# Patient Record
Sex: Male | Born: 1965 | Race: White | Hispanic: No | Marital: Single | Smoking: Current every day smoker
Health system: Southern US, Community
[De-identification: ages and names within clinical notes are randomized; demographics above are authoritative.]

## PROBLEM LIST (undated history)

## (undated) DIAGNOSIS — Z789 Other specified health status: Secondary | ICD-10-CM

## (undated) HISTORY — PX: NO PAST SURGERIES: SHX2092

---

## 1997-07-24 ENCOUNTER — Emergency Department (HOSPITAL_COMMUNITY): Admission: EM | Admit: 1997-07-24 | Discharge: 1997-07-24 | Payer: Self-pay | Admitting: Emergency Medicine

## 1998-02-27 ENCOUNTER — Emergency Department (HOSPITAL_COMMUNITY): Admission: EM | Admit: 1998-02-27 | Discharge: 1998-02-27 | Payer: Self-pay | Admitting: Emergency Medicine

## 1998-02-27 ENCOUNTER — Encounter: Payer: Self-pay | Admitting: Emergency Medicine

## 2005-11-07 ENCOUNTER — Ambulatory Visit (HOSPITAL_COMMUNITY): Admission: RE | Admit: 2005-11-07 | Discharge: 2005-11-07 | Payer: Self-pay | Admitting: Internal Medicine

## 2021-04-13 ENCOUNTER — Emergency Department (HOSPITAL_BASED_OUTPATIENT_CLINIC_OR_DEPARTMENT_OTHER): Payer: Self-pay

## 2021-04-13 ENCOUNTER — Emergency Department (HOSPITAL_BASED_OUTPATIENT_CLINIC_OR_DEPARTMENT_OTHER): Payer: Self-pay | Admitting: Anesthesiology

## 2021-04-13 ENCOUNTER — Other Ambulatory Visit: Payer: Self-pay

## 2021-04-13 ENCOUNTER — Ambulatory Visit (HOSPITAL_BASED_OUTPATIENT_CLINIC_OR_DEPARTMENT_OTHER)
Admission: EM | Admit: 2021-04-13 | Discharge: 2021-04-13 | Disposition: A | Discharge status: Home / Self Care | Payer: Self-pay | Attending: Emergency Medicine | Admitting: Emergency Medicine

## 2021-04-13 ENCOUNTER — Encounter (HOSPITAL_BASED_OUTPATIENT_CLINIC_OR_DEPARTMENT_OTHER): Payer: Self-pay

## 2021-04-13 ENCOUNTER — Encounter (HOSPITAL_COMMUNITY)
Admission: EM | Disposition: A | Discharge status: Home / Self Care | Payer: Self-pay | Source: Home / Self Care | Attending: Emergency Medicine

## 2021-04-13 ENCOUNTER — Emergency Department (HOSPITAL_COMMUNITY): Payer: Self-pay | Admitting: Anesthesiology

## 2021-04-13 DIAGNOSIS — Z23 Encounter for immunization: Secondary | ICD-10-CM | POA: Insufficient documentation

## 2021-04-13 DIAGNOSIS — F17210 Nicotine dependence, cigarettes, uncomplicated: Secondary | ICD-10-CM | POA: Insufficient documentation

## 2021-04-13 DIAGNOSIS — S61213A Laceration without foreign body of left middle finger without damage to nail, initial encounter: Secondary | ICD-10-CM | POA: Insufficient documentation

## 2021-04-13 DIAGNOSIS — S68121A Partial traumatic metacarpophalangeal amputation of left index finger, initial encounter: Secondary | ICD-10-CM

## 2021-04-13 DIAGNOSIS — X58XXXA Exposure to other specified factors, initial encounter: Secondary | ICD-10-CM | POA: Insufficient documentation

## 2021-04-13 DIAGNOSIS — S61211A Laceration without foreign body of left index finger without damage to nail, initial encounter: Secondary | ICD-10-CM

## 2021-04-13 DIAGNOSIS — W312XXA Contact with powered woodworking and forming machines, initial encounter: Secondary | ICD-10-CM | POA: Insufficient documentation

## 2021-04-13 HISTORY — PX: I & D EXTREMITY: SHX5045

## 2021-04-13 HISTORY — PX: STUMP REVISION: SHX6102

## 2021-04-13 HISTORY — DX: Other specified health status: Z78.9

## 2021-04-13 LAB — SURGICAL PCR SCREEN
MRSA, PCR: NEGATIVE
Staphylococcus aureus: NEGATIVE

## 2021-04-13 LAB — BASIC METABOLIC PANEL
Anion gap: 10 (ref 5–15)
BUN: 7 mg/dL (ref 6–20)
CO2: 24 mmol/L (ref 22–32)
Calcium: 9.5 mg/dL (ref 8.9–10.3)
Chloride: 105 mmol/L (ref 98–111)
Creatinine, Ser: 0.87 mg/dL (ref 0.61–1.24)
GFR, Estimated: >60 mL/min (ref >60)
Glucose, Bld: 102 mg/dL — ABNORMAL HIGH (ref 70–99)
Potassium: 3.5 mmol/L (ref 3.5–5.1)
Sodium: 139 mmol/L (ref 135–145)

## 2021-04-13 LAB — CBC
HCT: 43.4 % (ref 39.0–52.0)
Hemoglobin: 15.3 g/dL (ref 13.0–17.0)
MCH: 31.5 pg (ref 26.0–34.0)
MCHC: 35.3 g/dL (ref 30.0–36.0)
MCV: 89.3 fL (ref 80.0–100.0)
Platelets: 226 10*3/uL (ref 150–400)
RBC: 4.86 MIL/uL (ref 4.22–5.81)
RDW: 13.4 % (ref 11.5–15.5)
WBC: 10.1 10*3/uL (ref 4.0–10.5)
nRBC: 0 % (ref 0.0–0.2)

## 2021-04-13 SURGERY — REVISION, AMPUTATION SITE
Anesthesia: General | Site: Finger | Laterality: Right

## 2021-04-13 MED ORDER — KETOROLAC TROMETHAMINE 30 MG/ML IJ SOLN: 30.0000 mg | Freq: Once | INTRAMUSCULAR | Status: DC

## 2021-04-13 MED ORDER — OXYCODONE-ACETAMINOPHEN 5-325 MG PO TABS
1.0000 | ORAL_TABLET | Freq: Once | ORAL | Status: AC
  Administered 2021-04-13: 1 via ORAL
  Filled 2021-04-13: qty 1

## 2021-04-13 MED ORDER — MIDAZOLAM HCL 2 MG/2ML IJ SOLN
INTRAMUSCULAR | Status: AC
  Filled 2021-04-13: qty 2

## 2021-04-13 MED ORDER — LIDOCAINE HCL (PF) 1 % IJ SOLN
10.0000 mL | Freq: Once | INTRAMUSCULAR | Status: AC
  Administered 2021-04-13: 10 mL
  Filled 2021-04-13: qty 10

## 2021-04-13 MED ORDER — FENTANYL CITRATE (PF) 250 MCG/5ML IJ SOLN
INTRAMUSCULAR | Status: DC | PRN
Start: 2021-04-13 — End: 2021-04-13
  Administered 2021-04-13 (×2): 50 ug via INTRAVENOUS

## 2021-04-13 MED ORDER — TETANUS-DIPHTH-ACELL PERTUSSIS 5-2.5-18.5 LF-MCG/0.5 IM SUSY
0.5000 mL | PREFILLED_SYRINGE | Freq: Once | INTRAMUSCULAR | Status: AC
  Administered 2021-04-13: 0.5 mL via INTRAMUSCULAR
  Filled 2021-04-13: qty 0.5

## 2021-04-13 MED ORDER — MIDAZOLAM HCL 2 MG/2ML IJ SOLN
INTRAMUSCULAR | Status: DC | PRN
  Administered 2021-04-13: 2 mg via INTRAVENOUS

## 2021-04-13 MED ORDER — ORAL CARE MOUTH RINSE: 15.0000 mL | Freq: Once | OROMUCOSAL | Status: AC

## 2021-04-13 MED ORDER — AMISULPRIDE (ANTIEMETIC) 5 MG/2ML IV SOLN: 10.0000 mg | Freq: Once | INTRAVENOUS | Status: DC | PRN

## 2021-04-13 MED ORDER — FENTANYL CITRATE (PF) 100 MCG/2ML IJ SOLN: 25.0000 ug | INTRAMUSCULAR | Status: DC | PRN

## 2021-04-13 MED ORDER — OXYCODONE HCL 5 MG PO TABS: 5.0000 mg | ORAL_TABLET | Freq: Four times a day (QID) | ORAL | 0 refills | Status: AC | PRN

## 2021-04-13 MED ORDER — LACTATED RINGERS IV SOLN: INTRAVENOUS | Status: DC | PRN

## 2021-04-13 MED ORDER — CHLORHEXIDINE GLUCONATE 4 % EX LIQD: 60.0000 mL | Freq: Once | CUTANEOUS | Status: DC

## 2021-04-13 MED ORDER — HYDROMORPHONE HCL 1 MG/ML IJ SOLN
1.0000 mg | Freq: Once | INTRAMUSCULAR | Status: DC
  Filled 2021-04-13: qty 1

## 2021-04-13 MED ORDER — CHLORHEXIDINE GLUCONATE 0.12 % MT SOLN: 15.0000 mL | Freq: Once | OROMUCOSAL | Status: AC

## 2021-04-13 MED ORDER — PROPOFOL 10 MG/ML IV BOLUS
INTRAVENOUS | Status: DC | PRN
  Administered 2021-04-13: 50 mg via INTRAVENOUS

## 2021-04-13 MED ORDER — LIDOCAINE 2% (20 MG/ML) 5 ML SYRINGE
INTRAMUSCULAR | Status: DC | PRN
  Administered 2021-04-13: 30 mg via INTRAVENOUS

## 2021-04-13 MED ORDER — 0.9 % SODIUM CHLORIDE (POUR BTL) OPTIME
TOPICAL | Status: DC | PRN
  Administered 2021-04-13: 1000 mL

## 2021-04-13 MED ORDER — BUPIVACAINE HCL (PF) 0.25 % IJ SOLN
INTRAMUSCULAR | Status: DC | PRN
  Administered 2021-04-13: 10 mL

## 2021-04-13 MED ORDER — HYDROMORPHONE HCL 1 MG/ML IJ SOLN
1.0000 mg | Freq: Once | INTRAMUSCULAR | Status: AC
  Administered 2021-04-13: 1 mg via INTRAVENOUS

## 2021-04-13 MED ORDER — CEFAZOLIN SODIUM-DEXTROSE 2-4 GM/100ML-% IV SOLN
2.0000 g | INTRAVENOUS | Status: AC
  Administered 2021-04-13: 2 g via INTRAVENOUS
  Filled 2021-04-13: qty 100

## 2021-04-13 MED ORDER — FENTANYL CITRATE (PF) 100 MCG/2ML IJ SOLN
INTRAMUSCULAR | Status: AC
  Administered 2021-04-13: 50 ug
  Filled 2021-04-13: qty 2

## 2021-04-13 MED ORDER — CEFAZOLIN SODIUM-DEXTROSE 1-4 GM/50ML-% IV SOLN
1.0000 g | Freq: Once | INTRAVENOUS | Status: AC
  Administered 2021-04-13: 1 g via INTRAVENOUS
  Filled 2021-04-13: qty 50

## 2021-04-13 MED ORDER — CHLORHEXIDINE GLUCONATE 0.12 % MT SOLN
OROMUCOSAL | Status: AC
  Administered 2021-04-13: 15 mL via OROMUCOSAL
  Filled 2021-04-13: qty 15

## 2021-04-13 MED ORDER — OXYCODONE HCL 5 MG PO TABS: 5.0000 mg | ORAL_TABLET | Freq: Once | ORAL | Status: DC | PRN

## 2021-04-13 MED ORDER — BACITRACIN ZINC 500 UNIT/GM EX OINT
TOPICAL_OINTMENT | CUTANEOUS | Status: AC
  Filled 2021-04-13: qty 28.35

## 2021-04-13 MED ORDER — POVIDONE-IODINE 10 % EX SWAB
2.0000 "application " | Freq: Once | CUTANEOUS | Status: AC
  Administered 2021-04-13: 2 via TOPICAL

## 2021-04-13 MED ORDER — FENTANYL CITRATE (PF) 250 MCG/5ML IJ SOLN
INTRAMUSCULAR | Status: AC
  Filled 2021-04-13: qty 5

## 2021-04-13 MED ORDER — FENTANYL CITRATE PF 50 MCG/ML IJ SOSY: 50.0000 ug | PREFILLED_SYRINGE | Freq: Once | INTRAMUSCULAR | Status: DC

## 2021-04-13 MED ORDER — OXYCODONE HCL 5 MG/5ML PO SOLN: 5.0000 mg | Freq: Once | ORAL | Status: DC | PRN

## 2021-04-13 MED ORDER — LACTATED RINGERS IV SOLN: INTRAVENOUS | Status: DC

## 2021-04-13 MED ORDER — ACETAMINOPHEN 10 MG/ML IV SOLN: 1000.0000 mg | Freq: Once | INTRAVENOUS | Status: DC | PRN

## 2021-04-13 MED ORDER — OXYCODONE HCL 5 MG PO TABS: 5.0000 mg | ORAL_TABLET | Freq: Once | ORAL | Status: DC

## 2021-04-13 MED ORDER — BACITRACIN ZINC 500 UNIT/GM EX OINT
TOPICAL_OINTMENT | CUTANEOUS | Status: DC | PRN
  Administered 2021-04-13: 1 via TOPICAL

## 2021-04-13 SURGICAL SUPPLY — 51 items
APL PRP STRL LF DISP 70% ISPRP (MISCELLANEOUS) ×2
BAG COUNTER SPONGE SURGICOUNT (BAG) ×3 IMPLANT
BAG SPNG CNTER NS LX DISP (BAG)
BLADE SURG 15 STRL LF DISP TIS (BLADE) ×6 IMPLANT
BLADE SURG 15 STRL SS (BLADE) ×3
BNDG ELASTIC 2X5.8 VLCR STR LF (GAUZE/BANDAGES/DRESSINGS) ×4 IMPLANT
BNDG ELASTIC 4X5.8 VLCR STR LF (GAUZE/BANDAGES/DRESSINGS) ×3 IMPLANT
BNDG GAUZE ELAST 4 BULKY (GAUZE/BANDAGES/DRESSINGS) ×9 IMPLANT
CHLORAPREP W/TINT 26 (MISCELLANEOUS) ×4 IMPLANT
CORD BIPOLAR FORCEPS 12FT (ELECTRODE) ×4 IMPLANT
COVER BACK TABLE 60X90IN (DRAPES) ×4 IMPLANT
COVER MAYO STAND STRL (DRAPES) ×4 IMPLANT
COVER SURGICAL LIGHT HANDLE (MISCELLANEOUS) ×4 IMPLANT
CUFF TOURN SGL QUICK 18X4 (TOURNIQUET CUFF) ×3 IMPLANT
CUFF TOURN SGL QUICK 24 (TOURNIQUET CUFF)
CUFF TRNQT CYL 24X4X16.5-23 (TOURNIQUET CUFF) IMPLANT
DRAIN PENROSE 18X1/4 LTX STRL (DRAIN) ×3 IMPLANT
DRAPE EXTREMITY T 121X128X90 (DISPOSABLE) ×4 IMPLANT
DRAPE HALF SHEET 40X57 (DRAPES) ×4 IMPLANT
DRAPE SURG 17X23 STRL (DRAPES) ×3 IMPLANT
GAUZE SPONGE 4X4 12PLY STRL (GAUZE/BANDAGES/DRESSINGS) ×4 IMPLANT
GAUZE XEROFORM 1X8 LF (GAUZE/BANDAGES/DRESSINGS) ×4 IMPLANT
GLOVE SURG ENC TEXT LTX SZ7 (GLOVE) ×5 IMPLANT
GLOVE SURG UNDER POLY LF SZ7 (GLOVE) ×5 IMPLANT
GOWN STRL REUS W/ TWL LRG LVL3 (GOWN DISPOSABLE) ×6 IMPLANT
GOWN STRL REUS W/ TWL XL LVL3 (GOWN DISPOSABLE) ×6 IMPLANT
GOWN STRL REUS W/TWL LRG LVL3 (GOWN DISPOSABLE) ×6
GOWN STRL REUS W/TWL XL LVL3 (GOWN DISPOSABLE) ×3
KIT BASIN OR (CUSTOM PROCEDURE TRAY) ×4 IMPLANT
KIT TURNOVER KIT B (KITS) ×4 IMPLANT
NDL HYPO 25GX1X1/2 BEV (NEEDLE) IMPLANT
NDL HYPO 25X1 1.5 SAFETY (NEEDLE) ×3 IMPLANT
NEEDLE HYPO 25GX1X1/2 BEV (NEEDLE) ×3 IMPLANT
NEEDLE HYPO 25X1 1.5 SAFETY (NEEDLE) ×3 IMPLANT
NS IRRIG 1000ML POUR BTL (IV SOLUTION) ×4 IMPLANT
PACK ORTHO EXTREMITY (CUSTOM PROCEDURE TRAY) ×4 IMPLANT
PAD ARMBOARD 7.5X6 YLW CONV (MISCELLANEOUS) ×4 IMPLANT
PAD CAST 4YDX4 CTTN HI CHSV (CAST SUPPLIES) ×6 IMPLANT
PADDING CAST COTTON 4X4 STRL (CAST SUPPLIES)
SET CYSTO W/LG BORE CLAMP LF (SET/KITS/TRAYS/PACK) ×3 IMPLANT
SOL PREP POV-IOD 4OZ 10% (MISCELLANEOUS) ×6 IMPLANT
SPONGE T-LAP 4X18 ~~LOC~~+RFID (SPONGE) ×3 IMPLANT
SUT ETHILON 4 0 PS 2 18 (SUTURE) ×12 IMPLANT
SWAB CULTURE ESWAB REG 1ML (MISCELLANEOUS) IMPLANT
SYR CONTROL 10ML LL (SYRINGE) ×1 IMPLANT
TOWEL GREEN STERILE (TOWEL DISPOSABLE) ×4 IMPLANT
TOWEL GREEN STERILE FF (TOWEL DISPOSABLE) ×7 IMPLANT
TUBE CONNECTING 12X1/4 (SUCTIONS) ×3 IMPLANT
UNDERPAD 30X36 HEAVY ABSORB (UNDERPADS AND DIAPERS) ×4 IMPLANT
WATER STERILE IRR 1000ML POUR (IV SOLUTION) ×3 IMPLANT
YANKAUER SUCT BULB TIP NO VENT (SUCTIONS) ×3 IMPLANT

## 2021-04-13 NOTE — ED Notes (Signed)
Telephone report given to Junious Dresser RN at short stay. Patient arriving to facility via POV. Nurse made aware  ?

## 2021-04-13 NOTE — ED Triage Notes (Signed)
Patient states he accidentally ran his hand through a table saw. Patient has injury to right pointer finger and middle.  ?

## 2021-04-13 NOTE — Anesthesia Procedure Notes (Signed)
Procedure Name: LMA Insertion ?Date/Time: 04/13/2021 6:50 PM ?Performed by: Gwenyth Allegra, CRNA ?Pre-anesthesia Checklist: Patient identified, Emergency Drugs available, Suction available, Patient being monitored and Timeout performed ?Patient Re-evaluated:Patient Re-evaluated prior to induction ?Oxygen Delivery Method: Circle system utilized ?Preoxygenation: Pre-oxygenation with 100% oxygen ?Induction Type: IV induction ?LMA: LMA inserted ?LMA Size: 4.0 ?Placement Confirmation: positive ETCO2 and breath sounds checked- equal and bilateral ?Tube secured with: Tape ?Dental Injury: Teeth and Oropharynx as per pre-operative assessment  ? ? ? ? ?

## 2021-04-13 NOTE — Discharge Instructions (Addendum)
Please have your daughter drive you directly to the [facility] entrance, as shown on the map that was given to you.  We will proceed to the admissions desk upon entering the entrance hospital.  You are expected by Dr Yehuda Budd who is the orthopedic hand surgeon.  They will direct you where you need to go from the entrance.  There is a possibility you will end up requiring an operation today.  Please do not eat or drink any food after leaving our ER until you have talked to the surgeon. ? ? ? ? ?Waylan Rocher, M.D. ?Hand Surgery ? ?POST-OPERATIVE DISCHARGE INSTRUCTIONS ? ? ?PRESCRIPTIONS: ?You may have been given a prescription to be taken as directed for post-operative pain control.  You may also take over the counter ibuprofen/aleve and tylenol for pain. Take this as directed on the packaging. Do not exceed 3000 mg tylenol/acetaminophen in 24 hours. ? ?Ibuprofen 600-800 mg (3-4) tablets by mouth every 6 hours as needed for pain.  ?OR ?Aleve 2 tablets by mouth every 12 hours (twice daily) as needed for pain.  ?AND/OR ?Tylenol 1000 mg (2 tablets) every 8 hours as needed for pain. ? ?Please use your pain medication carefully, as refills are limited and you may not be provided with one.  As stated above, please use over the counter pain medicine - it will also be helpful with decreasing your swelling.  ? ? ?ANESTHESIA: ?After your surgery, post-surgical discomfort or pain is likely. This discomfort can last several days to a few weeks. At certain times of the day your discomfort may be more intense.  ? ?Did you receive a nerve block?  ?A nerve block can provide pain relief for one hour to two days after your surgery. As long as the nerve block is working, you will experience little or no sensation in the area the surgeon operated on.  ?As the nerve block wears off, you will begin to experience pain or discomfort. It is very important that you begin taking your prescribed pain medication before the nerve  block fully wears off. Treating your pain at the first sign of the block wearing off will ensure your pain is better controlled and more tolerable when full-sensation returns. Do not wait until the pain is intolerable, as the medicine will be less effective. It is better to treat pain in advance than to try and catch up.  ? ?General Anesthesia:  ?If you did not receive a nerve block during your surgery, you will need to start taking your pain medication shortly after your surgery and should continue to do so as prescribed by your surgeon.   ? ? ?ICE AND ELEVATION: ?Elevation, as much as possible for the next 48 hours, is critical for decreasing swelling as well as for pain relief. Elevation means when you are seated or lying down, you hand should be at or above your heart. When walking, the hand needs to be at or above the level of your elbow.  ?If the bandage gets too tight, it may need to be loosened. Please contact our office and we will instruct you in how to do this.  ? ? ?SURGICAL BANDAGES:  ?Keep your dressing and/or splint clean and dry at all times.  Do not remove until you are seen again in the office.  If careful, you may place a plastic bag over your bandage and tape the end to shower, but be careful, do not get your bandages wet.  ?  ? ?  HAND THERAPY:  ?You may not need any. If you do, we will begin this at your follow up visit in the clinic.  ? ? ?ACTIVITY AND WORK: ?Light use of the fingers is allowed to assist the other hand with daily hygiene and eating, but strong gripping or lifting is often uncomfortable and should be avoided.  ?You might miss a variable period of time from work and hopefully this issue has been discussed prior to surgery. You may not do any heavy work with your affected hand for about 2 weeks.  ? ? ?[facility] ?[address] ?[place],  [place]  [address] ?[phone]  ?

## 2021-04-13 NOTE — Op Note (Signed)
? ?  Date of Surgery: 04/13/2021 ? ?INDICATIONS: Anthony Pitts is a 56 y.o.-year-old male with near amputation of the left index finger at the level of the DIP joint with a dusky, dysvascular finger tip after a table saw injury earlier today.  He also has a superficial laceration of the volar aspect of the left middle finger at the level of the middle phalanx.  Risks, benefits, and alternatives to surgery were again discussed with the patient wishing to proceed with surgery.  Informed consent was signed after our discussion.  ? ?PREOPERATIVE DIAGNOSIS:  ?Partial amputation of left index finger ?Laceration of left middle finger ? ?POSTOPERATIVE DIAGNOSIS: Same. ? ?PROCEDURE:  ?Amputation of left index finger through middle phalanx ?Irrigation and debridement of left middle finger laceration ? ? ?SURGEON: Audria Nine, M.D. ? ?ASSIST:  ? ?ANESTHESIA:  general ? ?IV FLUIDS AND URINE: See anesthesia. ? ?ESTIMATED BLOOD LOSS: <5 mL. ? ?IMPLANTS: * No implants in log *  ? ?DRAINS: None ? ?COMPLICATIONS: None ? ?DESCRIPTION OF PROCEDURE: The patient was met in the preoperative holding area where the surgical site was marked and the consent form was verified.  The patient was then taken to the operating room and transferred to the operating table.  All bony prominences were well padded.  The operative extremity was prepped and draped in the usual and sterile fashion.  A formal time-out was performed to confirm that this was the correct patient, surgery, side, and site.  ? ?Following timeout, a finger tourniquet was applied to the index finger.  A fishmouth incision was drawn incorporating his existing near circumferential laceration.  The skin and subcutaneous tissue was divided.  The extensor apparatus was divided.  The flexor tendons were already divided traumatically by the table saw.  The DIP joint was disarticulated.  A bone cutting forcep was used to cut the middle phalanx.  A rondure was used to shorten the middle  phalanx to allow for a tension-free closure of the skin flaps.  The wound was thoroughly irrigated with copious sterile saline.  Tenotomy scissor was used to bluntly dissect and identify the radial and ulnar digital nerves.  A traction neurectomy was performed to prevent painful neuroma at the tip of the residual finger.  The fishmouth incision was closed using 4-0 Vicryl repeat suture in simple fashion.  The tourniquet was removed and the finger was warm and pink. ? ?I then turned my attention towards the middle finger.  There is a superficial laceration at the level of the volar middle phalanx.  Superficial skin flaps were sharply debrided.  The wound was closed with a single 4-0 Vicryl repeat suture.  Sterile irrigated with copious sterile saline. ? ?A digital block was performed of the index and middle finger using 10 cc of quarter percent Marcaine plain. ? ?The wounds were dressed with Xeroform, bacitracin, 4 x 4's, and the index and middle finger were wrapped together with a 2 inch Ace wrap. ? ?The patient was reversed from anesthesia and extubated uneventfully.  All counts were correct times two at the end of the procedure.  The patient was negative PACU in stable condition. ? ?POSTOPERATIVE PLAN: Patient be discharged home with appropriate pain medication and discharge instructions.  See him back in 1 week for a wound check. ? ?Audria Nine, MD ?7:42 PM  ?

## 2021-04-13 NOTE — Brief Op Note (Signed)
04/13/2021 ? ?7:42 PM ? ?PATIENT:  Anthony Pitts  56 y.o. male ? ?PRE-OPERATIVE DIAGNOSIS:  Right hand laceration ? ?POST-OPERATIVE DIAGNOSIS:  Right hand laceration ? ?PROCEDURE:  Procedure(s): ?AMPUTATION REVISION AND IRRIGATION AND DEBRIDEMENT LEFT INDEX FINGER--FINGERTIP DISCARDED PER DR Elsia Lasota'S VERBAL ORDER (Right) ? ?SURGEON:  Surgeon(s) and Role: ?   * Marlyne Beards, MD - Primary ? ?PHYSICIAN ASSISTANT:  ? ?ASSISTANTS: none  ? ?ANESTHESIA:   general ? ?EBL:  0 mL  ? ?BLOOD ADMINISTERED:none ? ?DRAINS: none  ? ?LOCAL MEDICATIONS USED:  MARCAINE    ? ?SPECIMEN:  No Specimen ? ?DISPOSITION OF SPECIMEN:  N/A ? ?COUNTS:  YES ? ?TOURNIQUET:  * Missing tourniquet times found for documented tourniquets in log: [id number] * ? ?DICTATION: .Dragon Dictation ? ?PLAN OF CARE: Discharge to home after PACU ? ?PATIENT DISPOSITION:  PACU - hemodynamically stable. ?  ?Delay start of Pharmacological VTE agent (>24hrs) due to surgical blood loss or risk of bleeding: not applicable ? ?

## 2021-04-13 NOTE — Consult Note (Signed)
Reason for Consult:Left hand injury ?Referring Physician: Matt Trifan ?Time called: 1047 ?Time at bedside: 1245 ? ? ?Anthony Pitts is an 56 y.o. male.  ?HPI: Omarius was working with a table saw. He's not sure how it happened but his left hand got caught by the blade and he suffered a near amputation of his index finger and some lacerations of the long finger. He came to the ED and hand surgery was consulted. He is RHD. ? ?Past Medical History:  ?Diagnosis Date  ? Medical history non-contributory   ? ? ?Past Surgical History:  ?Procedure Laterality Date  ? NO PAST SURGERIES    ? ? ?History reviewed. No pertinent family history. ? ?Social History:  reports that he has been smoking cigarettes. He has never used smokeless tobacco. He reports current drug use. Drug: Marijuana. He reports that he does not drink alcohol. ? ?Allergies: No Known Allergies ? ?Medications: I have reviewed the patient's current medications. ? ?Results for orders placed or performed during the hospital encounter of 04/13/21 (from the past 48 hour(s))  ?Basic metabolic panel     Status: Abnormal  ? Collection Time: 04/13/21 10:53 AM  ?Result Value Ref Range  ? Sodium 139 135 - 145 mmol/L  ? Potassium 3.5 3.5 - 5.1 mmol/L  ? Chloride 105 98 - 111 mmol/L  ? CO2 24 22 - 32 mmol/L  ? Glucose, Bld 102 (H) 70 - 99 mg/dL  ?  Comment: Glucose reference range applies only to samples taken after fasting for at least 8 hours.  ? BUN 7 6 - 20 mg/dL  ? Creatinine, Ser 0.87 0.61 - 1.24 mg/dL  ? Calcium 9.5 8.9 - 10.3 mg/dL  ? GFR, Estimated >60 >60 mL/min  ?  Comment: (NOTE) ?Calculated using the CKD-EPI Creatinine Equation (2021) ?  ? Anion gap 10 5 - 15  ?  Comment: Performed at [facility], [address], [place], [place] [address]  ?CBC     Status: None  ? Collection Time: 04/13/21 10:53 AM  ?Result Value Ref Range  ? WBC 10.1 4.0 - 10.5 K/uL  ? RBC 4.86 4.22 - 5.81 MIL/uL  ? Hemoglobin 15.3 13.0 - 17.0 g/dL  ? HCT 43.4 39.0 -  52.0 %  ? MCV 89.3 80.0 - 100.0 fL  ? MCH 31.5 26.0 - 34.0 pg  ? MCHC 35.3 30.0 - 36.0 g/dL  ? RDW 13.4 11.5 - 15.5 %  ? Platelets 226 150 - 400 K/uL  ? nRBC 0.0 0.0 - 0.2 %  ?  Comment: Performed at [facility], [address], [place],  [address]  ? ? ?DG Hand Complete Left ? ?Result Date: 04/13/2021 ?CLINICAL DATA:  Finger injury EXAM: LEFT HAND - COMPLETE 3+ VIEW COMPARISON:  None. FINDINGS: Large laceration injury identified at the volar aspect of the mid second finger. There is underlying focal fracture defect in the volar aspect of the middle phalanx. Associated soft tissue swelling of the second finger. No radiopaque foreign body identified. No additional acute osseous abnormality identified in the hand. IMPRESSION: Large laceration injury in the mid second finger with underlying focal open fracture defect in the middle phalanx. Electronically Signed   By: Delaney  Williams M.D.   On: 04/13/2021 11:11   ? ?Review of Systems  ?HENT:  Negative for ear discharge, ear pain, hearing loss and tinnitus.   ?Eyes:  Negative for photophobia and pain.  ?Respiratory:  Negative for cough and shortness of breath.   ?  Cardiovascular:  Negative for chest pain.  ?Gastrointestinal:  Negative for abdominal pain, nausea and vomiting.  ?Genitourinary:  Negative for dysuria, flank pain, frequency and urgency.  ?Musculoskeletal:  Positive for arthralgias (Left index/ring fingers). Negative for back pain, myalgias and neck pain.  ?Neurological:  Negative for dizziness and headaches.  ?Hematological:  Does not bruise/bleed easily.  ?Psychiatric/Behavioral:  The patient is not nervous/anxious.   ?Blood pressure (!) 166/69, pulse (!) 57, temperature 98.6 ?F (37 ?C), temperature source Oral, resp. rate 18, height 6' 2" (1.88 m), weight 69.9 kg, SpO2 97 %. ?Physical Exam ?Constitutional:   ?   General: He is not in acute distress. ?   Appearance: He is well-developed. He is not diaphoretic.  ?HENT:  ?   Head:  Normocephalic and atraumatic.  ?Eyes:  ?   General: No scleral icterus.    ?   Right eye: No discharge.     ?   Left eye: No discharge.  ?   Conjunctiva/sclera: Conjunctivae normal.  ?Cardiovascular:  ?   Rate and Rhythm: Normal rate and regular rhythm.  ?Pulmonary:  ?   Effort: Pulmonary effort is normal. No respiratory distress.  ?Musculoskeletal:  ?   Cervical back: Normal range of motion.  ?   Comments: Left shoulder, elbow, wrist, digits- Volar laceration index DIP joint ~270 degrees, numb to paresthetic distal, 0/5 DIP flex, cap refill ~5s, long finger with 2 smallish lacerations volar P2 and P3, sensation/motion intact, no instability, no blocks to motion ? Sens  Ax/M/U intact except as above, R paresthetic (subacute) ? Mot   Ax/ R/ PIN/ M/ AIN/ U intact ? Rad 2+  ?Skin: ?   General: Skin is warm and dry.  ?Neurological:  ?   Mental Status: He is alert.  ?Psychiatric:     ?   Mood and Affect: Mood normal.     ?   Behavior: Behavior normal.  ? ? ?Assessment/Plan: ?Left hand injury -- Plan I&D, repair vs revision by Dr. Benfield this afternoon. Please keep NPO. Anticipate discharge after surgery. ? ? ? ?Angelissa Supan J. Vishaal Strollo, PA-C ?Orthopedic Surgery ?[phone] ?04/13/2021, 1:04 PM  ?

## 2021-04-13 NOTE — H&P (View-Only) (Signed)
Reason for Consult:Left hand injury ?Referring Physician: Myrtie Cruise ?Time called: 1047 ?Time at bedside: 1245 ? ? ?Anthony Pitts is an 56 y.o. male.  ?HPI: Adonte was working with a table saw. He's not sure how it happened but his left hand got caught by the blade and he suffered a near amputation of his index finger and some lacerations of the long finger. He came to the ED and hand surgery was consulted. He is RHD. ? ?Past Medical History:  ?Diagnosis Date  ? Medical history non-contributory   ? ? ?Past Surgical History:  ?Procedure Laterality Date  ? NO PAST SURGERIES    ? ? ?History reviewed. No pertinent family history. ? ?Social History:  reports that he has been smoking cigarettes. He has never used smokeless tobacco. He reports current drug use. Drug: Marijuana. He reports that he does not drink alcohol. ? ?Allergies: No Known Allergies ? ?Medications: I have reviewed the patient's current medications. ? ?Results for orders placed or performed during the hospital encounter of 04/13/21 (from the past 48 hour(s))  ?Basic metabolic panel     Status: Abnormal  ? Collection Time: 04/13/21 10:53 AM  ?Result Value Ref Range  ? Sodium 139 135 - 145 mmol/L  ? Potassium 3.5 3.5 - 5.1 mmol/L  ? Chloride 105 98 - 111 mmol/L  ? CO2 24 22 - 32 mmol/L  ? Glucose, Bld 102 (H) 70 - 99 mg/dL  ?  Comment: Glucose reference range applies only to samples taken after fasting for at least 8 hours.  ? BUN 7 6 - 20 mg/dL  ? Creatinine, Ser 0.87 0.61 - 1.24 mg/dL  ? Calcium 9.5 8.9 - 10.3 mg/dL  ? GFR, Estimated >60 >60 mL/min  ?  Comment: (NOTE) ?Calculated using the CKD-EPI Creatinine Equation (2021) ?  ? Anion gap 10 5 - 15  ?  Comment: Performed at [facility], [address] [place], [place] [address]  ?CBC     Status: None  ? Collection Time: 04/13/21 10:53 AM  ?Result Value Ref Range  ? WBC 10.1 4.0 - 10.5 K/uL  ? RBC 4.86 4.22 - 5.81 MIL/uL  ? Hemoglobin 15.3 13.0 - 17.0 g/dL  ? HCT 43.4 39.0 -  52.0 %  ? MCV 89.3 80.0 - 100.0 fL  ? MCH 31.5 26.0 - 34.0 pg  ? MCHC 35.3 30.0 - 36.0 g/dL  ? RDW 13.4 11.5 - 15.5 %  ? Platelets 226 150 - 400 K/uL  ? nRBC 0.0 0.0 - 0.2 %  ?  Comment: Performed at [facility], [address], [place], [place] [address]  ? ? ?DG Hand Complete Left ? ?Result Date: 04/13/2021 ?CLINICAL DATA:  Finger injury EXAM: LEFT HAND - COMPLETE 3+ VIEW COMPARISON:  None. FINDINGS: Large laceration injury identified at the volar aspect of the mid second finger. There is underlying focal fracture defect in the volar aspect of the middle phalanx. Associated soft tissue swelling of the second finger. No radiopaque foreign body identified. No additional acute osseous abnormality identified in the hand. IMPRESSION: Large laceration injury in the mid second finger with underlying focal open fracture defect in the middle phalanx. Electronically Signed   By: Ofilia Neas M.D.   On: 04/13/2021 11:11   ? ?Review of Systems  ?HENT:  Negative for ear discharge, ear pain, hearing loss and tinnitus.   ?Eyes:  Negative for photophobia and pain.  ?Respiratory:  Negative for cough and shortness of breath.   ?  Cardiovascular:  Negative for chest pain.  ?Gastrointestinal:  Negative for abdominal pain, nausea and vomiting.  ?Genitourinary:  Negative for dysuria, flank pain, frequency and urgency.  ?Musculoskeletal:  Positive for arthralgias (Left index/ring fingers). Negative for back pain, myalgias and neck pain.  ?Neurological:  Negative for dizziness and headaches.  ?Hematological:  Does not bruise/bleed easily.  ?Psychiatric/Behavioral:  The patient is not nervous/anxious.   ?Blood pressure (!) 166/69, pulse (!) 57, temperature 98.6 ?F (37 ?C), temperature source Oral, resp. rate 18, height 6\' 2"  (1.88 m), weight 69.9 kg, SpO2 97 %. ?Physical Exam ?Constitutional:   ?   General: He is not in acute distress. ?   Appearance: He is well-developed. He is not diaphoretic.  ?HENT:  ?   Head:  Normocephalic and atraumatic.  ?Eyes:  ?   General: No scleral icterus.    ?   Right eye: No discharge.     ?   Left eye: No discharge.  ?   Conjunctiva/sclera: Conjunctivae normal.  ?Cardiovascular:  ?   Rate and Rhythm: Normal rate and regular rhythm.  ?Pulmonary:  ?   Effort: Pulmonary effort is normal. No respiratory distress.  ?Musculoskeletal:  ?   Cervical back: Normal range of motion.  ?   Comments: Left shoulder, elbow, wrist, digits- Volar laceration index DIP joint ~270 degrees, numb to paresthetic distal, 0/5 DIP flex, cap refill ~5s, long finger with 2 smallish lacerations volar P2 and P3, sensation/motion intact, no instability, no blocks to motion ? Sens  Ax/M/U intact except as above, R paresthetic (subacute) ? Mot   Ax/ R/ PIN/ M/ AIN/ U intact ? Rad 2+  ?Skin: ?   General: Skin is warm and dry.  ?Neurological:  ?   Mental Status: He is alert.  ?Psychiatric:     ?   Mood and Affect: Mood normal.     ?   Behavior: Behavior normal.  ? ? ?Assessment/Plan: ?Left hand injury -- Plan I&D, repair vs revision by Dr. Tempie Donning this afternoon. Please keep NPO. Anticipate discharge after surgery. ? ? ? ?Lisette Abu, PA-C ?Orthopedic Surgery ?[phone] ?04/13/2021, 1:04 PM  ?

## 2021-04-13 NOTE — ED Provider Notes (Signed)
?[facility] EMERGENCY DEPT ?Provider Note ? ? ?CSN: [id number] ?Arrival date & time: 04/13/21  1027 ? ?  ? ?History ? ?Chief Complaint  ?Patient presents with  ? Hand Injury  ? ? ?KRISTOFF COONRADT is a 56 y.o. male who is right handed presenting to ED with left finger laceration on a table saw this morning, just prior to arriving in ED.  Unsure of last tetanus shot.  Laceration through distal left 2nd and 3rd fingers.  Diminished sensation in distal left 2nd finger tip.  Not on A/C.  No significant blood loss.  No other injuries. ? ?HPI ? ?  ? ?Home Medications ?Prior to Admission medications   ?Not on File  ?   ? ?Allergies    ?Patient has no known allergies.   ? ?Review of Systems   ?Review of Systems ? ?Physical Exam ?Updated Vital Signs ?BP (!) 154/80   Pulse 68   Temp 98.3 ?F (36.8 ?C)   Resp 16   Ht 6\' 2"  (1.88 m)   Wt 69.9 kg   SpO2 100%   BMI 19.77 kg/m?  ?Physical Exam ?Constitutional:   ?   General: He is not in acute distress. ?HENT:  ?   Head: Normocephalic and atraumatic.  ?Eyes:  ?   Conjunctiva/sclera: Conjunctivae normal.  ?   Pupils: Pupils are equal, round, and reactive to light.  ?Cardiovascular:  ?   Rate and Rhythm: Normal rate and regular rhythm.  ?Pulmonary:  ?   Effort: Pulmonary effort is normal. No respiratory distress.  ?Musculoskeletal:  ?   Comments: Partial amputation of left 2nd distal finger, laceration through flexor surface near DIP, no active bleeding, distal tip is dusky with minimal/no cap refill visible ?3rd finger smaller laceration, normal distal sensation and cap refill  ?Skin: ?   General: Skin is warm and dry.  ?Neurological:  ?   General: No focal deficit present.  ?   Mental Status: He is alert. Mental status is at baseline.  ?Psychiatric:     ?   Mood and Affect: Mood normal.     ?   Behavior: Behavior normal.  ? ? ?ED Results / Procedures / Treatments   ?Labs ?(all labs ordered are listed, but only abnormal results are displayed) ?Labs Reviewed - No  data to display ? ?EKG ?None ? ?Radiology ?No results found. ? ?Procedures ?Procedures  ? ? ?Medications Ordered in ED ?Medications - No data to display ? ?ED Course/ Medical Decision Making/ A&P ?Clinical Course as of 04/13/21 1759  ?Fri Apr 13, 2021  ?1049 Contacted Apr 15, 2021 oncall PA for hand surgery, concerning partial finger amputation and concern for dusky finger/possible vascular injury. 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  We will attempt to irrigate the wound as best we can.  Jamelle Rushing requests patient transfer to [facility] for OR, anticipating likely amputation.  Pt stable to go by POV with daughter driving him - wound will be cleaned and wrapped [MT]  ?  ?Clinical Course User Index ?[MT] [facility], MD  ? ?                        ?Medical Decision Making ?Amount and/or Complexity of Data Reviewed ?Labs: ordered. ?Radiology: ordered. ? ?Risk ?Prescription drug management. ? ? ?  [place] injury of left hand, 2nd and 3rd fingers ?Will update tetanus ?IV ancef ordered ?IV pain medication ?Bleeding controlled on arrival ?Concern for developing ischemia of distal left 2nd finger - stat consult placed with ortho hand, unclear if this would be a salvageable injury ?Basic pre-op labs ordered in case of need for OR ?Dg hand ordered and reviewed, showing BMP and CBC largely unremarkable ? ?Clinical Course as of 04/13/21 1800  ?Fri Apr 13, 2021  ?1049 Contacted Jamelle Rushing oncall PA for hand surgery, concerning partial finger amputation and concern for dusky finger/possible vascular injury. 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  We will  attempt to irrigate the wound as best we can.  Charma Igo requests patient transfer to [facility] for OR, anticipating likely amputation.  Pt stable to go by POV with daughter driving him - wound will be cleaned and wrapped [MT]  ?  ?Clinical Course User Index ?[MT] Terald Sleeper, MD  ? ? ? ? ? ? ? ? ? ?Final Clinical Impression(s) / ED Diagnoses ?Final diagnoses:  ?None  ? ? ?Rx / DC Orders ?ED Discharge Orders   ? ? None  ? ?  ? ? ?  ?Terald Sleeper, MD ?04/13/21 1801 ? ?

## 2021-04-13 NOTE — Transfer of Care (Signed)
Immediate Anesthesia Transfer of Care Note ? ?Patient: Anthony Pitts ? ?Procedure(s) Performed: AMPUTATION REVISION AND IRRIGATION AND DEBRIDEMENT LEFT INDEX FINGER--FINGERTIP DISCARDED PER DR BENFIELD'S VERBAL ORDER (Right: Finger) ?IRRIGATION AND DEBRIDEMENT LEFT (MIDDLE) LONG FINGER (Left: Finger) ? ?Patient Location: PACU ? ?Anesthesia Type:General ? ?Level of Consciousness: awake and alert  ? ?Airway & Oxygen Therapy: Patient Spontanous Breathing ? ?Post-op Assessment: Report given to RN and Post -op Vital signs reviewed and stable ? ?Post vital signs: Reviewed and stable ? ?Last Vitals:  ?Vitals Value Taken Time  ?BP 146/83   ?Temp    ?Pulse 65 04/13/21 1951  ?Resp 19 04/13/21 1951  ?SpO2 94 % 04/13/21 1951  ?Vitals shown include unvalidated device data. ? ?Last Pain:  ?Vitals:  ? 04/13/21 1259  ?TempSrc:   ?PainSc: 6   ?   ? ?Patients Stated Pain Goal: 2 (04/13/21 1259) ? ?Complications: No notable events documented. ?

## 2021-04-13 NOTE — Interval H&P Note (Signed)
History and Physical Interval Note: ? ?04/13/2021 ?5:26 PM ? ?Anthony Pitts  has presented today for surgery, with the diagnosis of Right hand laceration.  The various methods of treatment have been discussed with the patient and family. After consideration of risks, benefits and other options for treatment, the patient has consented to  Procedure(s): ?AMPUTATION REVISION AND IRRIGATION AND DEBRIDMENT OF RIGHT HAND (Right) as a surgical intervention.  The patient's history has been reviewed, patient examined, no change in status, stable for surgery.  I have reviewed the patient's chart and labs.  Questions were answered to the patient's satisfaction.   ? ? ?Malana Eberwein Hezakiah Champeau ? ? ?

## 2021-04-13 NOTE — Anesthesia Preprocedure Evaluation (Signed)
Anesthesia Evaluation  ?Patient identified by MRN, date of birth, ID band ?Patient awake ? ? ? ?Reviewed: ?Allergy & Precautions, NPO status , Patient's Chart, lab work & pertinent test results ? ?Airway ?Mallampati: III ? ?TM Distance: >3 FB ?Neck ROM: Full ? ? ? Dental ? ?(+) Edentulous Upper, Missing ?  ?Pulmonary ?Current SmokerPatient did not abstain from smoking.,  ?  ?Pulmonary exam normal ?breath sounds clear to auscultation ? ? ? ? ? ? Cardiovascular ?negative cardio ROS ?Normal cardiovascular exam ?Rhythm:Regular Rate:Normal ? ? ?  ?Neuro/Psych ?negative neurological ROS ? negative psych ROS  ? GI/Hepatic ?negative GI ROS, Neg liver ROS,   ?Endo/Other  ?negative endocrine ROS ? Renal/GU ?negative Renal ROS  ? ?  ?Musculoskeletal ? ? Abdominal ?  ?Peds ? Hematology ?negative hematology ROS ?(+)   ?Anesthesia Other Findings ?Right hand laceration ? Reproductive/Obstetrics ? ?  ? ? ? ? ? ? ? ? ? ? ? ? ? ?  ?  ? ? ? ? ? ? ? ? ?Anesthesia Physical ?Anesthesia Plan ? ?ASA: 2 ? ?Anesthesia Plan: General  ? ?Post-op Pain Management:   ? ?Induction: Intravenous ? ?PONV Risk Score and Plan: 1 and Ondansetron, Dexamethasone, Midazolam and Treatment may vary due to age or medical condition ? ?Airway Management Planned: LMA ? ?Additional Equipment:  ? ?Intra-op Plan:  ? ?Post-operative Plan: Extubation in OR ? ?Informed Consent: I have reviewed the patients History and Physical, chart, labs and discussed the procedure including the risks, benefits and alternatives for the proposed anesthesia with the patient or authorized representative who has indicated his/her understanding and acceptance.  ? ? ? ?Dental advisory given ? ?Plan Discussed with: CRNA ? ?Anesthesia Plan Comments:   ? ? ? ? ? ? ?Anesthesia Quick Evaluation ? ?

## 2021-04-15 NOTE — Anesthesia Postprocedure Evaluation (Signed)
Anesthesia Post Note ? ?Patient: Anthony Pitts ? ?Procedure(s) Performed: AMPUTATION REVISION AND IRRIGATION AND DEBRIDEMENT LEFT INDEX FINGER--FINGERTIP DISCARDED PER DR BENFIELD'S VERBAL ORDER (Right: Finger) ?IRRIGATION AND DEBRIDEMENT LEFT (MIDDLE) LONG FINGER (Left: Finger) ? ?  ? ?Patient location during evaluation: PACU ?Anesthesia Type: General ?Level of consciousness: awake and alert ?Pain management: pain level controlled ?Vital Signs Assessment: post-procedure vital signs reviewed and stable ?Respiratory status: spontaneous breathing, nonlabored ventilation, respiratory function stable and patient connected to nasal cannula oxygen ?Cardiovascular status: blood pressure returned to baseline and stable ?Postop Assessment: no apparent nausea or vomiting ?Anesthetic complications: no ? ? ?No notable events documented. ? ?Last Vitals:  ?Vitals:  ? 04/13/21 2000 04/13/21 2015  ?BP: (!) 159/99 (!) 144/89  ?Pulse: 62   ?Resp: 14   ?Temp:  36.7 ?C  ?SpO2: 95%   ?  ?Last Pain:  ?Vitals:  ? 04/13/21 2015  ?TempSrc:   ?PainSc: 0-No pain  ? ? ?  ?  ?  ?  ?  ?  ? ?[name] ? ? ? ? ?

## 2021-04-16 ENCOUNTER — Encounter (HOSPITAL_COMMUNITY): Payer: Self-pay | Admitting: Orthopedic Surgery

## 2021-04-19 ENCOUNTER — Encounter: Payer: Self-pay | Admitting: Orthopedic Surgery

## 2021-04-19 ENCOUNTER — Ambulatory Visit (INDEPENDENT_AMBULATORY_CARE_PROVIDER_SITE_OTHER): Payer: Self-pay | Admitting: Orthopedic Surgery

## 2021-04-19 DIAGNOSIS — S61211A Laceration without foreign body of left index finger without damage to nail, initial encounter: Secondary | ICD-10-CM

## 2021-04-19 NOTE — Progress Notes (Signed)
? ?  Post-Op Visit Note ?  ?Patient: Anthony Pitts           ?Date of Birth: 1965-09-05           ?MRN: [id number] ?Visit Date: 04/19/2021 ?PCP: Pcp, No ? ? ?Assessment & Plan: ? ?Chief Complaint:  ?Chief Complaint  ?Patient presents with  ? Left Index Finger - Routine Post Op  ? ?Visit Diagnoses:  ?1. Laceration of left index finger without foreign body without damage to nail, initial encounter   ? ? ?Plan: Patient is one week s/p revision amputation of left index finger for near amputation with a dysvascular finger tip and irrigation of a superficial volar middle finger laceration.  He is doing well postoperatively.  The skin flaps remains well approximated with vicryl rapide suture.  There is no surrounding erythema or induration.  His hand was washed today and redressed with bacitracin, xeroform, 4x4, and loose Coban.  Patient to start showering and performing daily wound care starting a few days from now.  I will see him back next week for another wound check.  ? ?Follow-Up Instructions: No follow-ups on file.  ? ?Orders:  ?No orders of the defined types were placed in this encounter. ? ?No orders of the defined types were placed in this encounter. ? ? ?Imaging: ?No results found. ? ?PMFS History: ?Patient Active Problem List  ? Diagnosis Date Noted  ? Laceration of left index finger without foreign body without damage to nail   ? ?Past Medical History:  ?Diagnosis Date  ? Medical history non-contributory   ?  ?History reviewed. No pertinent family history.  ?Past Surgical History:  ?Procedure Laterality Date  ? I & D EXTREMITY Left 04/13/2021  ? Procedure: IRRIGATION AND DEBRIDEMENT LEFT (MIDDLE) LONG FINGER;  Surgeon: Sherilyn Cooter, MD;  Location: [facility];  Service: Orthopedics;  Laterality: Left;  ? NO PAST SURGERIES    ? STUMP REVISION Right 04/13/2021  ? Procedure: AMPUTATION REVISION AND IRRIGATION AND DEBRIDEMENT LEFT INDEX FINGER--FINGERTIP DISCARDED PER DR Yanissa Michalsky'S VERBAL ORDER;  Surgeon: Sherilyn Cooter, MD;  Location: [facility];  Service: Orthopedics;  Laterality: Right;  ? ?Social History  ? ?Occupational History  ? Not on file  ?Tobacco Use  ? Smoking status: Every Day  ?  Types: Cigarettes  ? Smokeless tobacco: Never  ?Vaping Use  ? Vaping Use: Never used  ?Substance and Sexual Activity  ? Alcohol use: Never  ? Drug use: Yes  ?  Types: Marijuana  ?  Comment: 1-2 times a day  ? Sexual activity: Not on file  ? ? ? ?

## 2021-04-26 ENCOUNTER — Encounter: Payer: Self-pay | Admitting: Orthopedic Surgery

## 2021-04-26 ENCOUNTER — Ambulatory Visit (INDEPENDENT_AMBULATORY_CARE_PROVIDER_SITE_OTHER): Payer: Self-pay | Admitting: Orthopedic Surgery

## 2021-04-26 DIAGNOSIS — S61211A Laceration without foreign body of left index finger without damage to nail, initial encounter: Secondary | ICD-10-CM

## 2021-04-26 NOTE — Progress Notes (Signed)
? ?  Post-Op Visit Note ?  ?Patient: DELRON COMER           ?Date of Birth: [birth year]           ?MRN: [id number] ?Visit Date: 04/26/2021 ?PCP: Pcp, No ? ? ?Assessment & Plan: ? ?Chief Complaint:  ?Chief Complaint  ?Patient presents with  ? Left Hand - Follow-up  ? Right Hand - Follow-up  ? ?Visit Diagnoses:  ?1. Laceration of left index finger without foreign body without damage to nail, initial encounter   ? ? ?Plan: Patient is 2 weeks out status post left index finger revision amputation.  His incision remains well approximated.  There is no erythema or induration.  The distal aspect of the finger is appropriately swollen for this point postoperatively.  He can continue daily wound care.  I can see him back in another 2 weeks. ? ?Follow-Up Instructions: No follow-ups on file.  ? ?Orders:  ?No orders of the defined types were placed in this encounter. ? ?No orders of the defined types were placed in this encounter. ? ? ?Imaging: ?No results found. ? ?PMFS History: ?Patient Active Problem List  ? Diagnosis Date Noted  ? Laceration of left index finger without foreign body without damage to nail   ? ?Past Medical History:  ?Diagnosis Date  ? Medical history non-contributory   ?  ?History reviewed. No pertinent family history.  ?Past Surgical History:  ?Procedure Laterality Date  ? I & D EXTREMITY Left 04/13/2021  ? Procedure: IRRIGATION AND DEBRIDEMENT LEFT (MIDDLE) LONG FINGER;  Surgeon: Marlyne Beards, MD;  Location: [facility];  Service: Orthopedics;  Laterality: Left;  ? NO PAST SURGERIES    ? STUMP REVISION Right 04/13/2021  ? Procedure: AMPUTATION REVISION AND IRRIGATION AND DEBRIDEMENT LEFT INDEX FINGER--FINGERTIP DISCARDED PER DR Niccolas Loeper'S VERBAL ORDER;  Surgeon: Marlyne Beards, MD;  Location: [facility] OR;  Service: Orthopedics;  Laterality: Right;  ? ?Social History  ? ?Occupational History  ? Not on file  ?Tobacco Use  ? Smoking status: Every Day  ?  Types: Cigarettes  ? Smokeless tobacco: Never  ?Vaping Use   ? Vaping Use: Never used  ?Substance and Sexual Activity  ? Alcohol use: Never  ? Drug use: Yes  ?  Types: Marijuana  ?  Comment: 1-2 times a day  ? Sexual activity: Not on file  ? ? ? ?

## 2021-04-27 ENCOUNTER — Other Ambulatory Visit: Payer: Self-pay | Admitting: Orthopedic Surgery

## 2021-04-27 ENCOUNTER — Telehealth: Payer: Self-pay | Admitting: Orthopedic Surgery

## 2021-04-27 MED ORDER — TRAMADOL HCL 50 MG PO TABS: 50.0000 mg | ORAL_TABLET | Freq: Four times a day (QID) | ORAL | 0 refills | Status: AC | PRN

## 2021-04-27 MED ORDER — MELOXICAM 7.5 MG PO TABS: 7.5000 mg | ORAL_TABLET | Freq: Every day | ORAL | 0 refills | Status: AC

## 2021-04-27 NOTE — Telephone Encounter (Signed)
Are you refill pain med too? ?

## 2021-04-27 NOTE — Telephone Encounter (Signed)
Pt is calling back again --to make sure he gets his medication  ?

## 2021-04-27 NOTE — Telephone Encounter (Signed)
Patient called concerning the Rx for pain and  antiinflammatory medication is not showing received at the pharmacy. Patient said to send the Rx to [facility] on [address]. Patient asked if this information can be updated in the system? ? The number to contact patient is [phone] ?

## 2021-04-27 NOTE — Telephone Encounter (Signed)
Please see message below. Patient is post op from 04/13/2021 ?

## 2021-04-27 NOTE — Telephone Encounter (Signed)
Pt is calling again needs anti inflamatory and pain meds sent in --before the end of the day  ?

## 2021-04-27 NOTE — Telephone Encounter (Signed)
Please advise 

## 2021-04-27 NOTE — Telephone Encounter (Signed)
Per Dr. Frazier Butt sent in tramadol as well but will be last rx. Pt was called and advised  ?

## 2021-05-10 ENCOUNTER — Ambulatory Visit (INDEPENDENT_AMBULATORY_CARE_PROVIDER_SITE_OTHER): Payer: Self-pay | Admitting: Orthopedic Surgery

## 2021-05-10 DIAGNOSIS — S61211A Laceration without foreign body of left index finger without damage to nail, initial encounter: Secondary | ICD-10-CM

## 2021-05-10 NOTE — Progress Notes (Signed)
? ?  Post-Op Visit Note ?  ?Patient: Anthony Pitts           ?Date of Birth: 1965-06-06           ?MRN: [id number] ?Visit Date: 05/10/2021 ?PCP: Pcp, No ? ? ?Assessment & Plan: ? ?Chief Complaint:  ?Chief Complaint  ?Patient presents with  ? Other  ?  Follow up left index finger  ? ?Visit Diagnoses:  ?1. Laceration of left index finger without foreign body without damage to nail, initial encounter   ? ? ?Plan: Patient is four weeks s/p left index finger revision amputation.  He is doing well.  His wound is well healed.  The finger tip swelling continues to improve.  He can follow up with me again as needed.  ? ?Follow-Up Instructions: No follow-ups on file.  ? ?Orders:  ?No orders of the defined types were placed in this encounter. ? ?No orders of the defined types were placed in this encounter. ? ? ?Imaging: ?No results found. ? ?PMFS History: ?Patient Active Problem List  ? Diagnosis Date Noted  ? Laceration of left index finger without foreign body without damage to nail   ? ?Past Medical History:  ?Diagnosis Date  ? Medical history non-contributory   ?  ?No family history on file.  ?Past Surgical History:  ?Procedure Laterality Date  ? I & D EXTREMITY Left 04/13/2021  ? Procedure: IRRIGATION AND DEBRIDEMENT LEFT (MIDDLE) LONG FINGER;  Surgeon: Sherilyn Cooter, MD;  Location: [facility];  Service: Orthopedics;  Laterality: Left;  ? NO PAST SURGERIES    ? STUMP REVISION Right 04/13/2021  ? Procedure: AMPUTATION REVISION AND IRRIGATION AND DEBRIDEMENT LEFT INDEX FINGER--FINGERTIP DISCARDED PER DR Aniya Jolicoeur'S VERBAL ORDER;  Surgeon: Sherilyn Cooter, MD;  Location: [facility];  Service: Orthopedics;  Laterality: Right;  ? ?Social History  ? ?Occupational History  ? Not on file  ?Tobacco Use  ? Smoking status: Every Day  ?  Types: Cigarettes  ? Smokeless tobacco: Never  ?Vaping Use  ? Vaping Use: Never used  ?Substance and Sexual Activity  ? Alcohol use: Never  ? Drug use: Yes  ?  Types: Marijuana  ?  Comment: 1-2 times a day   ? Sexual activity: Not on file  ? ? ? ?

## 2021-05-31 ENCOUNTER — Ambulatory Visit: Payer: Self-pay | Admitting: Specialist

## 2021-07-04 ENCOUNTER — Ambulatory Visit: Payer: Self-pay | Admitting: Specialist

## 2022-09-27 IMAGING — DX DG HAND COMPLETE 3+V*L*
3 series · 3 of 3 positions shown · non-contrast
Comparison: None.

CLINICAL DATA: Finger injury

EXAM:
LEFT HAND - COMPLETE 3+ VIEW

[hand ap]
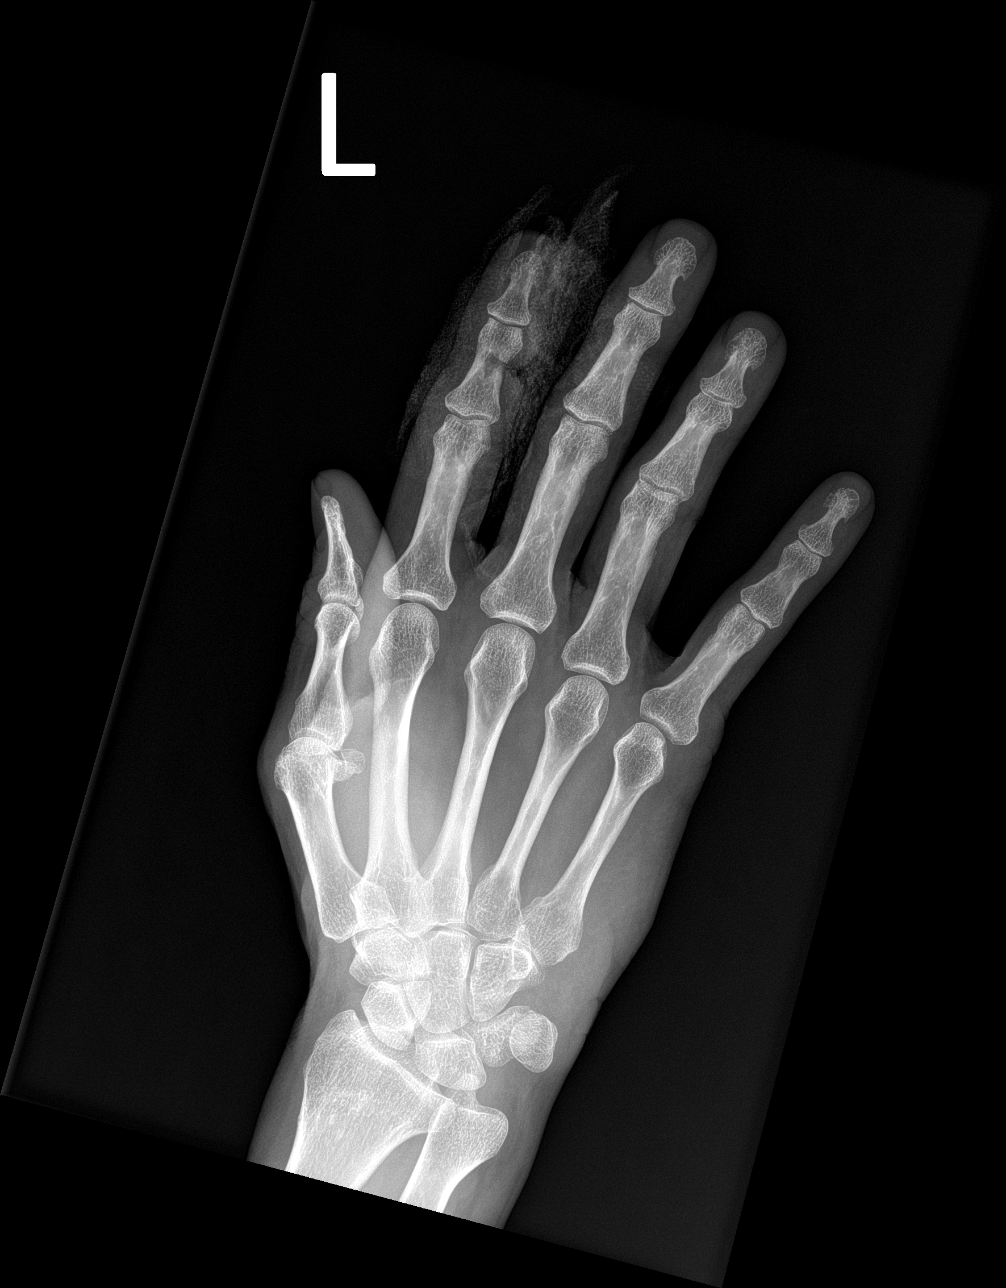

[hand lat]
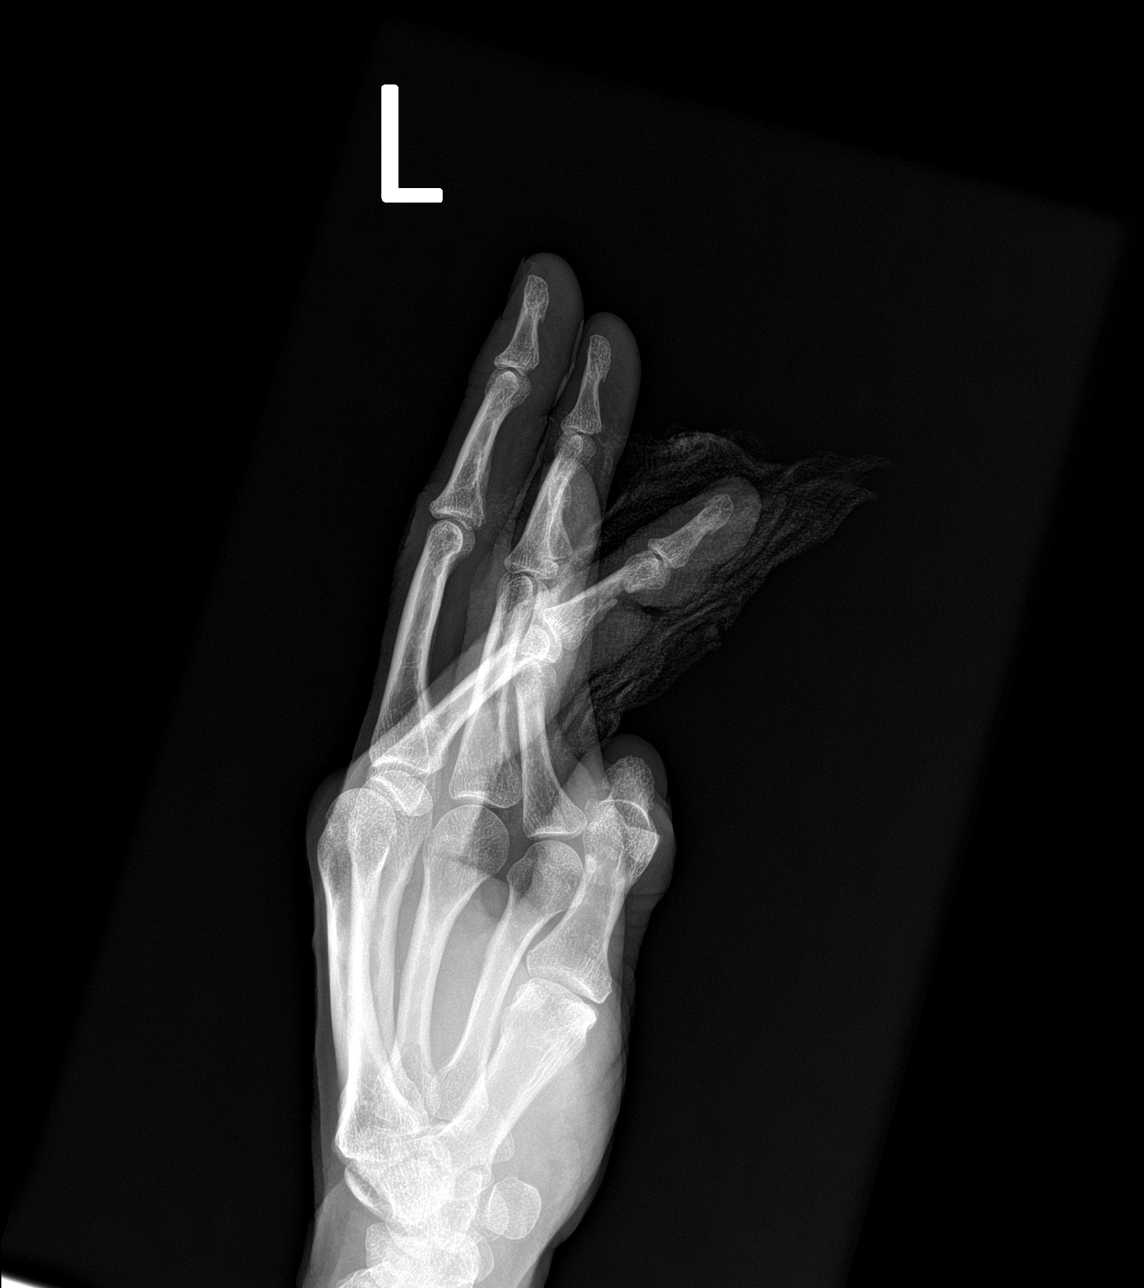

[hand obl]
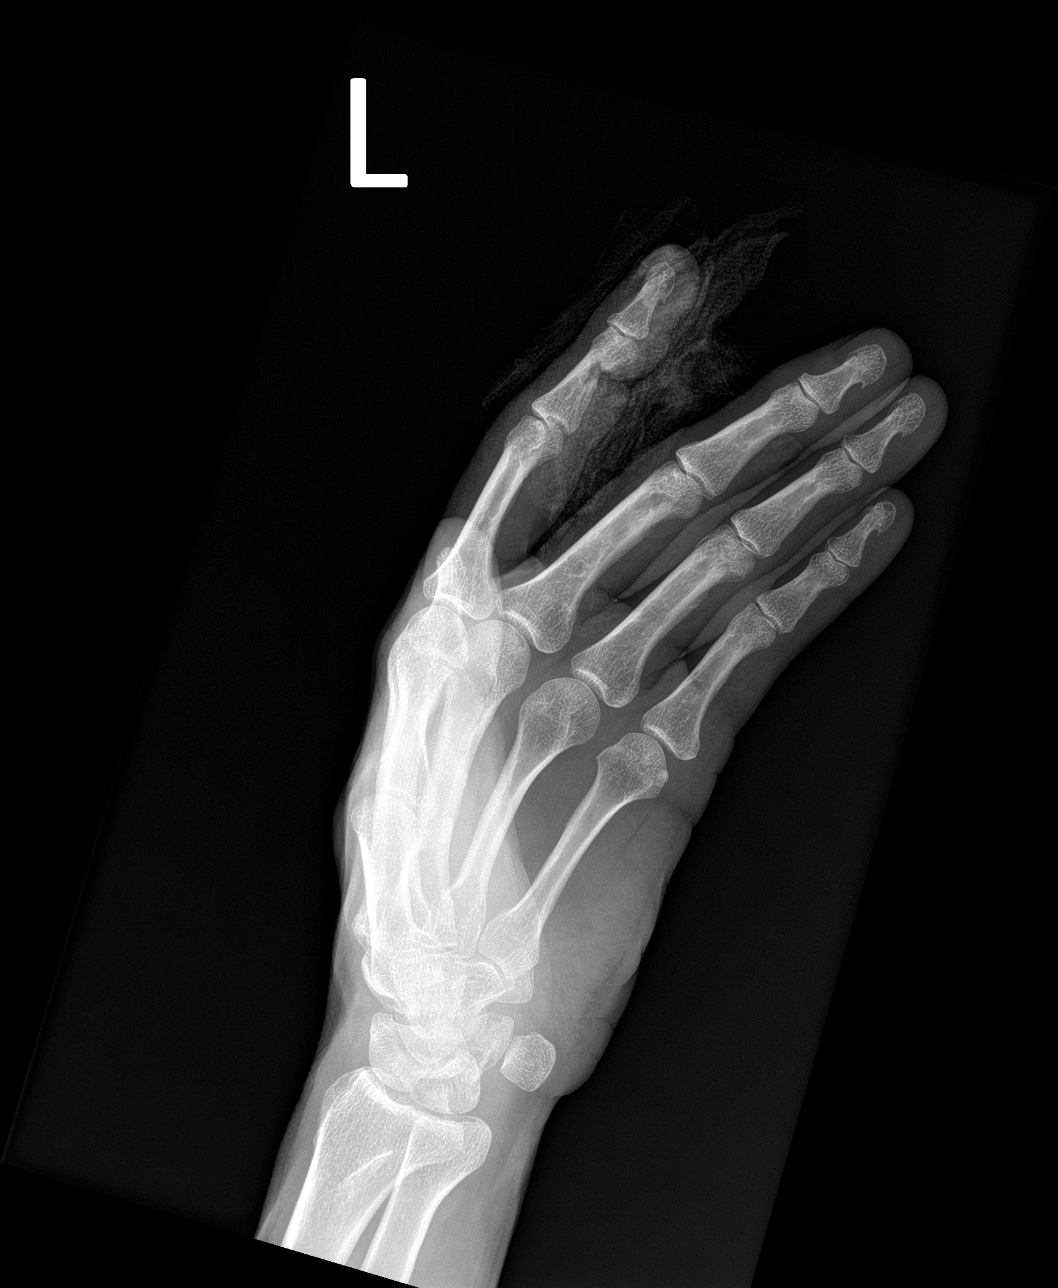

[3 of 3 positions shown; findings below may reference images not displayed]

FINDINGS: Large laceration injury identified at the volar aspect of the mid
second finger. There is underlying focal fracture defect in the
volar aspect of the middle phalanx. Associated soft tissue swelling
of the second finger. No radiopaque foreign body identified.

No additional acute osseous abnormality identified in the hand.
IMPRESSION: Large laceration injury in the mid second finger with underlying
focal open fracture defect in the middle phalanx.
